# Patient Record
Sex: Female | Born: 2000 | Race: Black or African American | Hispanic: No | Marital: Single | Smoking: Never smoker
Health system: Southern US, Community
[De-identification: ages and names within clinical notes are randomized; demographics above are authoritative.]

## PROBLEM LIST (undated history)

## (undated) DIAGNOSIS — E785 Hyperlipidemia, unspecified: Secondary | ICD-10-CM

## (undated) DIAGNOSIS — Z973 Presence of spectacles and contact lenses: Secondary | ICD-10-CM

## (undated) HISTORY — DX: Presence of spectacles and contact lenses: Z97.3

## (undated) HISTORY — DX: Hyperlipidemia, unspecified: E78.5

## (undated) HISTORY — PX: NO PAST SURGERIES: SHX2092

---
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
## 2020-04-30 IMAGING — US US PELVIS COMPLETE
1 series · 14 of 25 positions shown · non-contrast
Comparison: 04/01/2013

CLINICAL DATA: Secondary amenorrhea.

EXAM:
TRANSABDOMINAL ULTRASOUND OF PELVIS
TECHNIQUE: Transabdominal ultrasound examination of the pelvis was performed
including evaluation of the uterus, ovaries, adnexal regions, and
pelvic cul-de-sac.

[Series 1: us pelvis complete · 0.22mm/px · 14 of 32 slices shown]
[im 1/32]
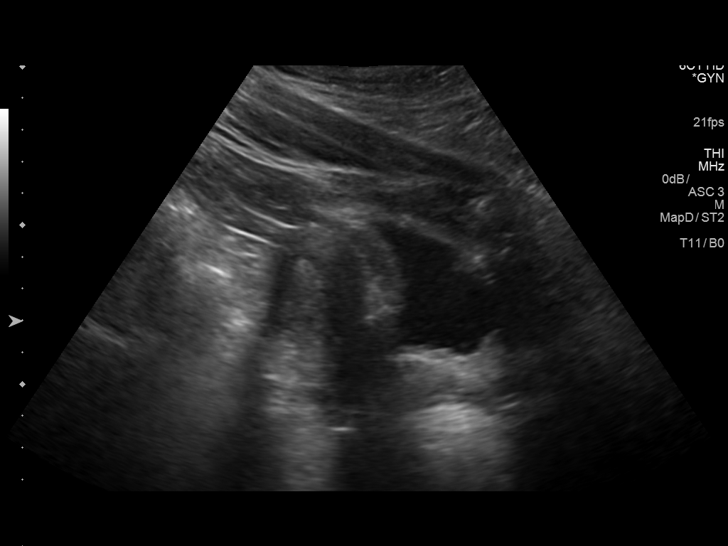
[im 3/32]
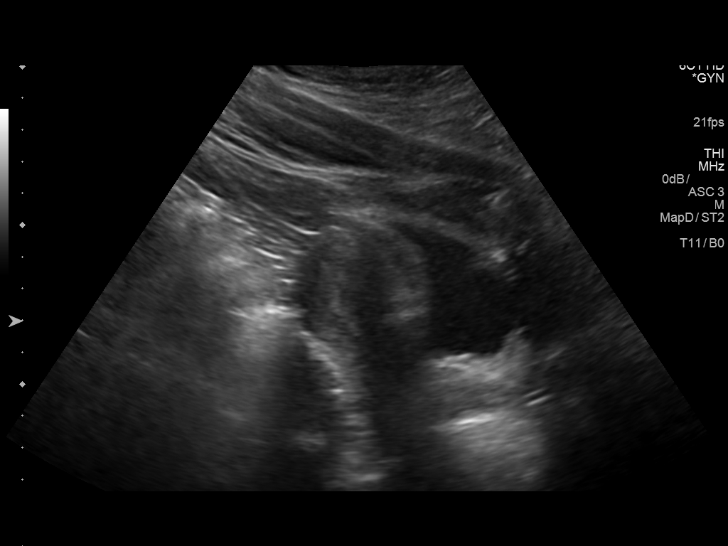
[im 6/32]
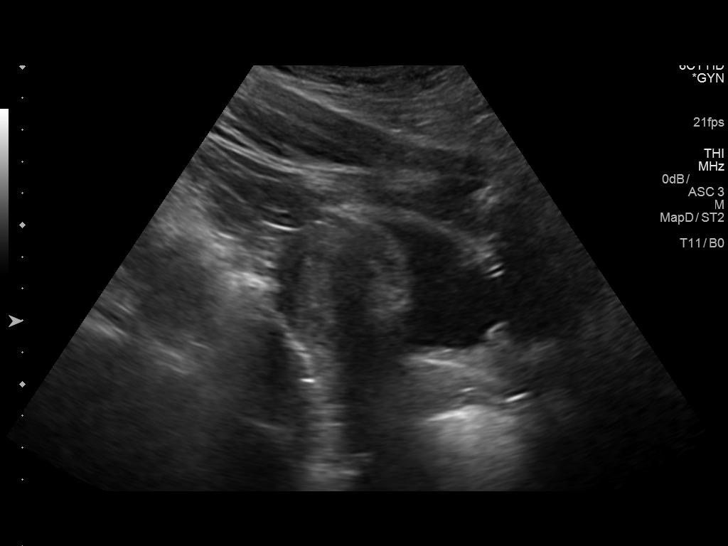
[im 8/32]
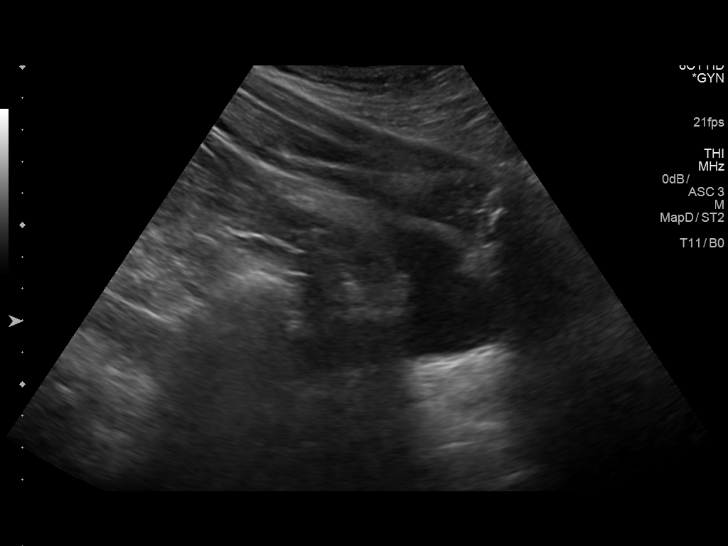
[im 11/32]
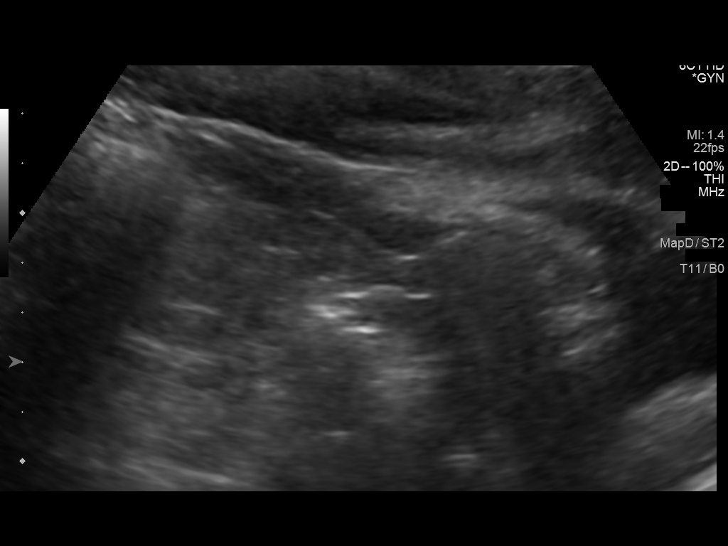
[im 12/32]
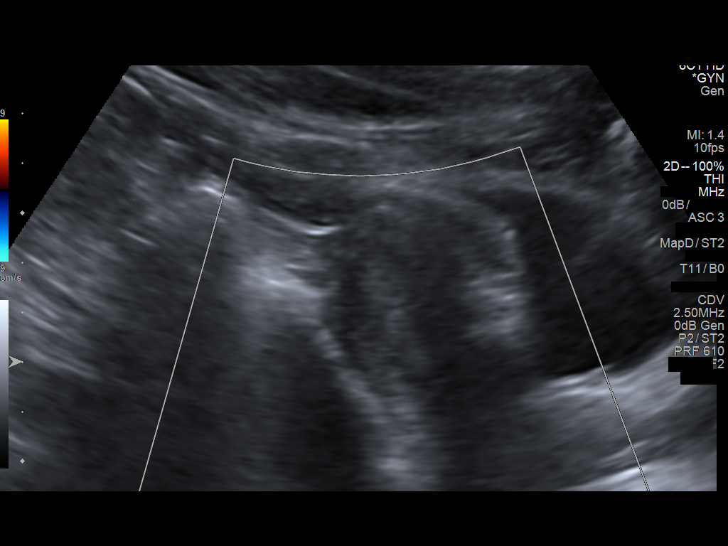
[im 15/32]
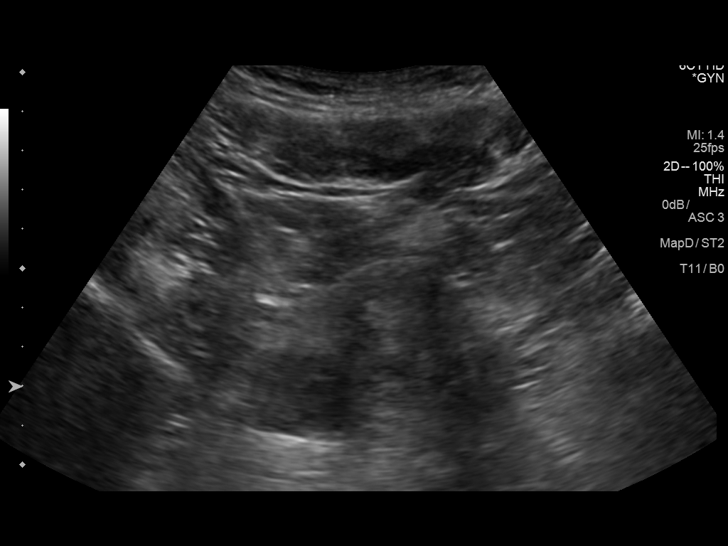
[im 17/32]
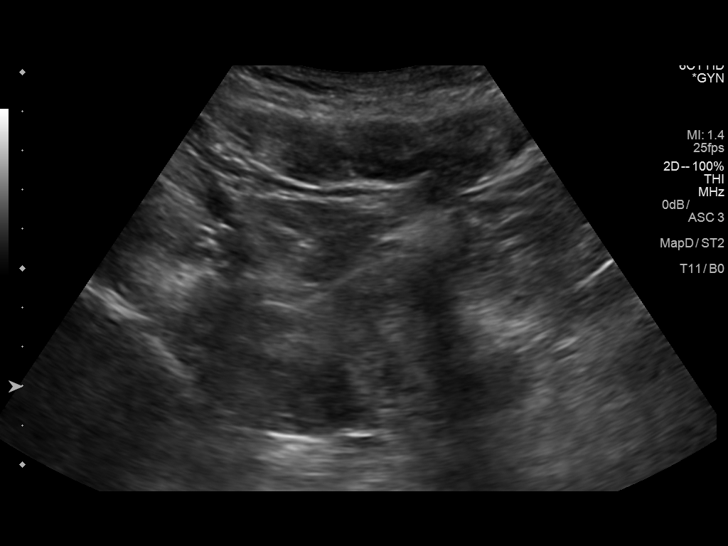
[im 20/32]
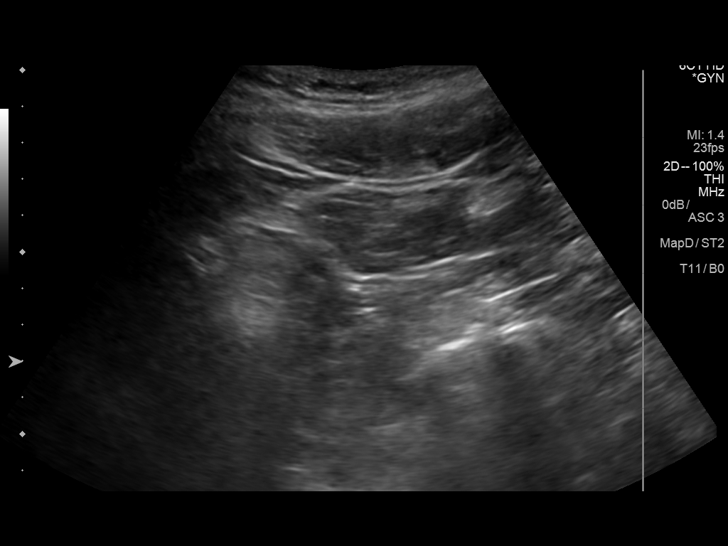
[im 21/32]
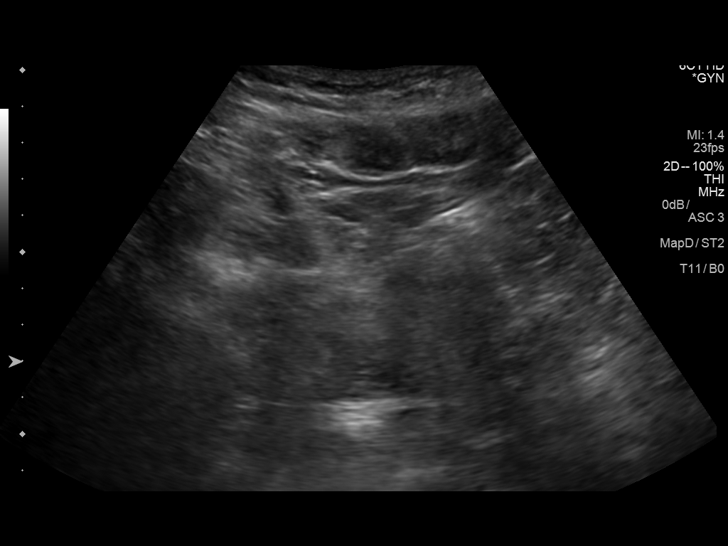
[im 24/32]
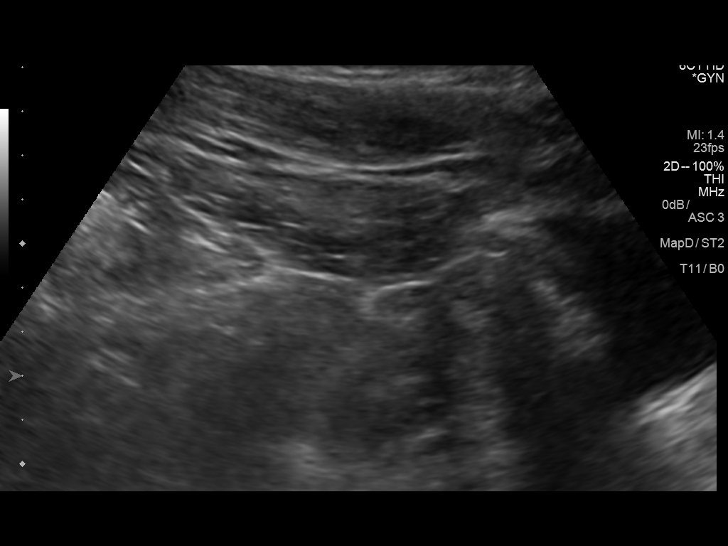
[im 26/32]
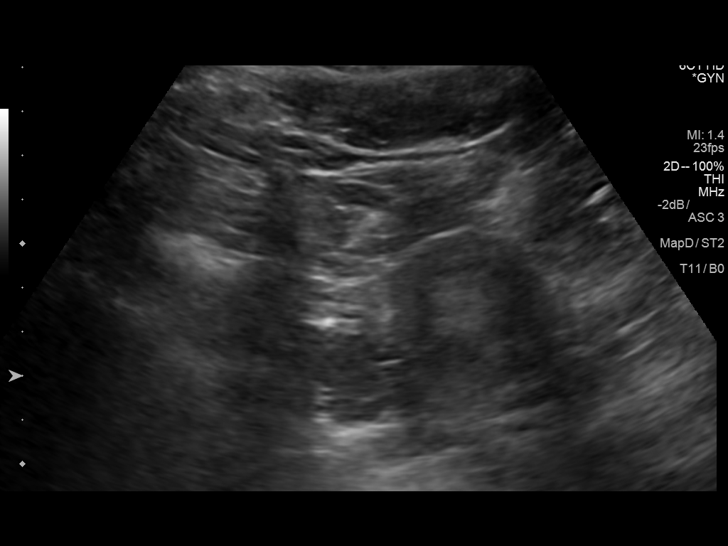
[im 29/32]
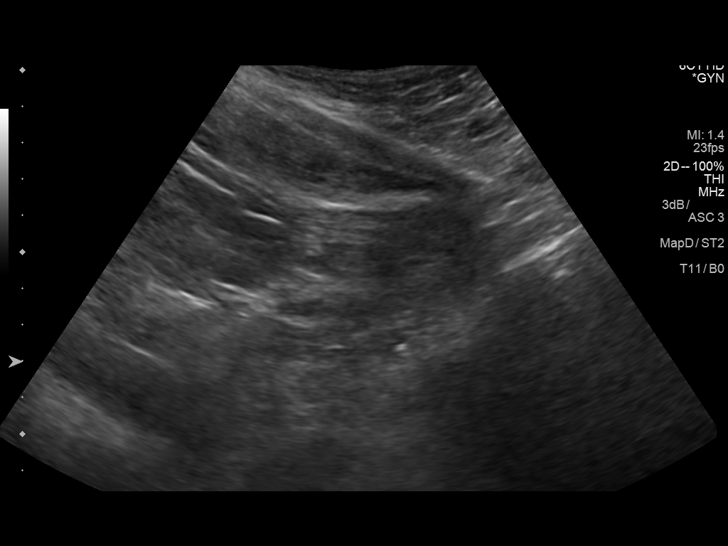
[im 32/32]
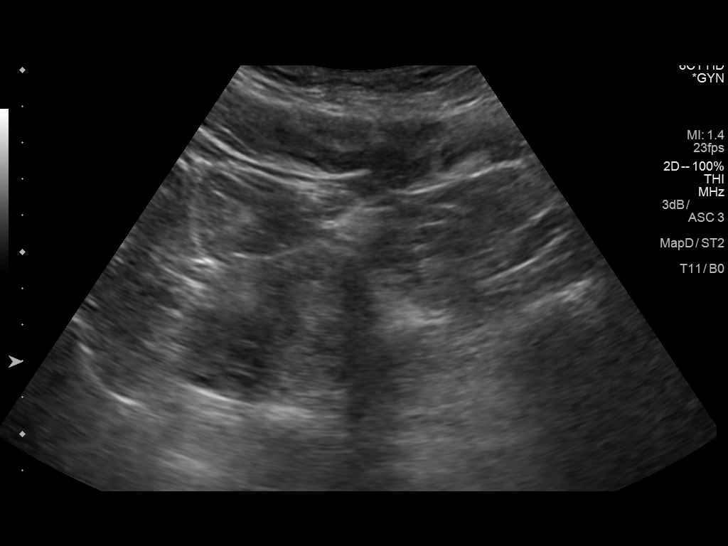

[14 of 25 positions shown; findings below may reference images not displayed]

FINDINGS: Uterus

Measurements: 6.3 x 3.1 x 3.7 cm = volume: 38 mL. Normal in
morphology.

Endometrium

Thickness: Normal, 12 mm.  No focal abnormality visualized.

Right ovary

Measurements: 4.3 x 2.1 x 2.2 cm = volume: 10 mL. Normal in
morphology.

Left ovary

Not visualized-no adnexal mass seen.

Other findings:  No abnormal free fluid.
IMPRESSION: 1. Lack of visualization of the left ovary. Otherwise, normal
transabdominal pelvic ultrasound.
2. Transvaginal scanning was not performed at patient request.

## 2020-08-28 ENCOUNTER — Ambulatory Visit (INDEPENDENT_AMBULATORY_CARE_PROVIDER_SITE_OTHER): Payer: 59 | Admitting: Family Medicine

## 2020-08-28 ENCOUNTER — Other Ambulatory Visit: Payer: Self-pay

## 2020-08-28 ENCOUNTER — Encounter: Payer: Self-pay | Admitting: Family Medicine

## 2020-08-28 VITALS — BP 122/74 | HR 106 | Ht 63.25 in | Wt 256.0 lb

## 2020-08-28 DIAGNOSIS — F4323 Adjustment disorder with mixed anxiety and depressed mood: Secondary | ICD-10-CM | POA: Diagnosis not present

## 2020-08-28 DIAGNOSIS — Z0000 Encounter for general adult medical examination without abnormal findings: Secondary | ICD-10-CM | POA: Diagnosis not present

## 2020-08-28 NOTE — Progress Notes (Signed)
Pt is not sexually active.  

## 2020-08-28 NOTE — Progress Notes (Signed)
Subjective:     Whitney Griffin is a 20 y.o. female and is here for a comprehensive physical exam. The patient reports no problems.  She did screen positive on the PHQ and the GAD-7.  Reviewed those results today.  She is currently working part-time at [employer] and is going to school full-time.  She walks daily with her new dog which has been very positive for her and occasionally does weights.  She is only getting about 4 to 5 hours of sleep at night.  Social History   Socioeconomic History   Marital status: Single    Spouse name: Not on file   Number of children: Not on file   Years of education: Not on file   Highest education level: Not on file  Occupational History   Occupation: Dentist: Minor   Tobacco Use   Smoking status: Never   Smokeless tobacco: Never  Substance and Sexual Activity   Alcohol use: No   Drug use: No   Sexual activity: Not on file  Other Topics Concern   Not on file  Social History Narrative   Studetn at [employer] in the 9th grade. + Cheerleading.  Tack and filed.  Does well in school. Born in [facility].  Lives with mother [name], father [name], older brother [name]. Mom is a Runner, broadcasting/film/video and has her BA degree. Father has his masters.  Sing in chorus.     Social Determinants of Health   Financial Resource Strain: Not on file  Food Insecurity: Not on file  Transportation Needs: Not on file  Physical Activity: Not on file  Stress: Not on file  Social Connections: Not on file  Intimate Partner Violence: Not on file   Health Maintenance  Topic Date Due   HIV Screening  Never done   Hepatitis C Screening  Never done   INFLUENZA VACCINE  09/24/2020   TETANUS/TDAP  10/02/2021   HPV VACCINES  Completed   COVID-19 Vaccine  Completed   Pneumococcal Vaccine 21-98 Years old  Aged Out    The following portions of the patient's history were reviewed and updated as appropriate: allergies, current medications, past  family history, past medical history, past social history, past surgical history, and problem list.  Review of Systems Pertinent items are noted in HPI.   Objective:    BP 122/74   Pulse (!) 106   Ht 5' 3.25" (1.607 m)   Wt 256 lb (116.1 kg)   LMP 04/04/2020 (Exact Date)   SpO2 99%   BMI 44.99 kg/m  General appearance: alert, cooperative, and appears stated age Head: Normocephalic, without obvious abnormality, atraumatic Eyes:  conj clear, EOMI, PEERLA Ears: normal TM's and external ear canals both ears Nose: Nares normal. Septum midline. Mucosa normal. No drainage or sinus tenderness. Throat: lips, mucosa, and tongue normal; teeth and gums normal Neck: no adenopathy, no JVD, supple, symmetrical, trachea midline, and thyroid not enlarged, symmetric, no tenderness/mass/nodules Back: symmetric, no curvature. ROM normal. No CVA tenderness. Lungs: clear to auscultation bilaterally Heart: regular rate and rhythm, S1, S2 normal, no murmur, click, rub or gallop Abdomen: soft, non-tender; bowel sounds normal; no masses,  no organomegaly  Extremities: extremities normal, atraumatic, no cyanosis or edema Pulses: 2+ and symmetric Skin: Skin color, texture, turgor normal. No rashes or lesions Lymph nodes: Cervical adenopathy: nl and Supraclavicular adenopathy: nl    Assessment:    Healthy female exam.    Plan:  See After Visit Summary for Counseling Recommendations  Keep up a regular exercise program and make sure you are eating a healthy diet Try to eat 4 servings of dairy a day, or if you are lactose intolerant take a calcium with vitamin D daily.  Your vaccines are up to date.   Moderate depression and anxiety symptoms currently-discussed options.  She is interested in therapy/counseling as she has never done this before.  New referral placed.

## 2020-09-03 ENCOUNTER — Encounter: Payer: No Typology Code available for payment source | Admitting: Family Medicine

## 2020-09-18 LAB — TSH: TSH: 2.09 mIU/L

## 2020-09-18 LAB — LIPID PANEL W/REFLEX DIRECT LDL
Cholesterol: 221 mg/dL — ABNORMAL HIGH (ref <200)
HDL: 52 mg/dL (ref >=50)
LDL Cholesterol (Calc): 150 mg/dL (calc) — ABNORMAL HIGH
Non-HDL Cholesterol (Calc): 169 mg/dL (calc) — ABNORMAL HIGH (ref <130)
Total CHOL/HDL Ratio: 4.3 (calc) (ref <5.0)
Triglycerides: 88 mg/dL (ref <150)

## 2020-09-18 LAB — COMPLETE METABOLIC PANEL WITH GFR
AG Ratio: 1.1 (calc) (ref 1.0–2.5)
ALT: 21 U/L (ref 6–29)
AST: 19 U/L (ref 10–30)
Albumin: 4.3 g/dL (ref 3.6–5.1)
Alkaline phosphatase (APISO): 99 U/L (ref 31–125)
BUN: 12 mg/dL (ref 7–25)
CO2: 24 mmol/L (ref 20–32)
Calcium: 9.8 mg/dL (ref 8.6–10.2)
Chloride: 104 mmol/L (ref 98–110)
Creat: 0.75 mg/dL (ref 0.50–0.96)
Globulin: 3.8 g/dL (calc) — ABNORMAL HIGH (ref 1.9–3.7)
Glucose, Bld: 80 mg/dL (ref 65–99)
Potassium: 4.4 mmol/L (ref 3.5–5.3)
Sodium: 136 mmol/L (ref 135–146)
Total Bilirubin: 0.3 mg/dL (ref 0.2–1.2)
Total Protein: 8.1 g/dL (ref 6.1–8.1)
eGFR: 117 mL/min/{1.73_m2} (ref >=60)

## 2020-09-18 LAB — CBC
HCT: 45.3 % — ABNORMAL HIGH (ref 35.0–45.0)
Hemoglobin: 15.5 g/dL (ref 11.7–15.5)
MCH: 28.9 pg (ref 27.0–33.0)
MCHC: 34.2 g/dL (ref 32.0–36.0)
MCV: 84.5 fL (ref 80.0–100.0)
MPV: 10.4 fL (ref 7.5–12.5)
Platelets: 351 10*3/uL (ref 140–400)
RBC: 5.36 10*6/uL — ABNORMAL HIGH (ref 3.80–5.10)
RDW: 12.9 % (ref 11.0–15.0)
WBC: 5.1 10*3/uL (ref 3.8–10.8)

## 2020-09-24 ENCOUNTER — Ambulatory Visit (INDEPENDENT_AMBULATORY_CARE_PROVIDER_SITE_OTHER): Payer: 59 | Admitting: Professional

## 2020-09-24 DIAGNOSIS — F4321 Adjustment disorder with depressed mood: Secondary | ICD-10-CM

## 2020-09-24 DIAGNOSIS — F411 Generalized anxiety disorder: Secondary | ICD-10-CM | POA: Diagnosis not present

## 2020-09-27 ENCOUNTER — Telehealth: Payer: Self-pay | Admitting: Family Medicine

## 2020-09-27 NOTE — Telephone Encounter (Signed)
Patient dropped off forms for PCP to fill out. Billing form attached and placed in provider box. AM *09/27/20

## 2020-09-28 NOTE — Telephone Encounter (Signed)
2 forms were in the folder.  1 was for a physical exam everything is complete except they do want [place] to do a urinalysis and it says it is required so she will have to come by and give a urine sample and then we should be able to complete that one for her.  The second form was for request to have a support animal in her living space.  It also requires that she have a significant impact her disability that requires an animal which she does not currently have so I do not have adequate information or documentation to fill out her form for support animal.  They have to have a physical or mental impairment that "substantially limits" a major life activity.

## 2020-10-01 NOTE — Telephone Encounter (Signed)
Whitney Griffin called back asking about the form. I advised as recommended by Dr Linford Arnold. She states she will not have time to do the urinalysis. She will have the form filled out by the school.

## 2020-10-08 ENCOUNTER — Ambulatory Visit (INDEPENDENT_AMBULATORY_CARE_PROVIDER_SITE_OTHER): Payer: 59 | Admitting: Professional

## 2020-10-08 ENCOUNTER — Ambulatory Visit: Payer: 59 | Admitting: Professional

## 2020-10-08 DIAGNOSIS — F411 Generalized anxiety disorder: Secondary | ICD-10-CM

## 2020-10-08 DIAGNOSIS — F4321 Adjustment disorder with depressed mood: Secondary | ICD-10-CM

## 2020-10-19 ENCOUNTER — Telehealth: Payer: Self-pay

## 2020-10-19 NOTE — Telephone Encounter (Signed)
I do have the records in my basket I will take a look at those.

## 2020-10-19 NOTE — Telephone Encounter (Signed)
Whitney Griffin called and left a message asking about the medical records and the need of a support animal.

## 2020-10-23 ENCOUNTER — Encounter: Payer: Self-pay | Admitting: Family Medicine

## 2020-10-23 NOTE — Telephone Encounter (Signed)
Pt called and asked if her forms have been completed. I informed her that they have not. However, Dr. Linford Arnold is looking at them.   She asked if these can be completed today. She stated that she doesn't mean to be a bother but she will need to turn them in ASAP.

## 2020-10-23 NOTE — Telephone Encounter (Signed)
I advised patient to MyChart [place] the form.

## 2020-10-24 ENCOUNTER — Ambulatory Visit (INDEPENDENT_AMBULATORY_CARE_PROVIDER_SITE_OTHER): Payer: 59 | Admitting: Professional

## 2020-10-24 DIAGNOSIS — F4321 Adjustment disorder with depressed mood: Secondary | ICD-10-CM | POA: Diagnosis not present

## 2020-10-24 DIAGNOSIS — F411 Generalized anxiety disorder: Secondary | ICD-10-CM

## 2020-10-24 NOTE — Telephone Encounter (Signed)
Patient advised. She will pick up later.

## 2020-10-24 NOTE — Telephone Encounter (Signed)
Patient advised. She will pick up later.  

## 2020-10-24 NOTE — Telephone Encounter (Signed)
Forms completed and palced on [employer]. I think there are 2 notes open about this.

## 2020-10-24 NOTE — Telephone Encounter (Signed)
Forms completed

## 2020-11-07 ENCOUNTER — Ambulatory Visit (INDEPENDENT_AMBULATORY_CARE_PROVIDER_SITE_OTHER): Payer: 59 | Admitting: Professional

## 2020-11-07 DIAGNOSIS — F411 Generalized anxiety disorder: Secondary | ICD-10-CM | POA: Diagnosis not present

## 2020-11-07 DIAGNOSIS — F4321 Adjustment disorder with depressed mood: Secondary | ICD-10-CM | POA: Diagnosis not present

## 2020-11-28 ENCOUNTER — Ambulatory Visit (INDEPENDENT_AMBULATORY_CARE_PROVIDER_SITE_OTHER): Payer: 59 | Admitting: Professional

## 2020-11-28 DIAGNOSIS — F411 Generalized anxiety disorder: Secondary | ICD-10-CM | POA: Diagnosis not present

## 2020-11-28 DIAGNOSIS — F4321 Adjustment disorder with depressed mood: Secondary | ICD-10-CM | POA: Diagnosis not present

## 2020-12-10 ENCOUNTER — Ambulatory Visit: Payer: 59 | Admitting: Professional

## 2020-12-26 ENCOUNTER — Ambulatory Visit: Payer: 59 | Admitting: Professional

## 2021-08-29 ENCOUNTER — Telehealth: Payer: Self-pay | Admitting: Family Medicine

## 2021-08-29 NOTE — Telephone Encounter (Signed)
Patients mother dropped off Health Forms for School to be completed by PCP Linford Arnold). Patients mother was informed of possible fee and 3-5 day turn around - paperwork placed in providers box - lmr.

## 2021-08-29 NOTE — Telephone Encounter (Signed)
Pt has an appt on 8/15 with pcp

## 2021-08-30 NOTE — Telephone Encounter (Signed)
Patient's mother stated she would be by later to pay the fee & pick up the forms, placed in paperwork payment according waiting for pick up.A MUCK

## 2021-08-30 NOTE — Telephone Encounter (Signed)
Form completed, and placed up front for pt's mother to be contacted for payment.

## 2021-09-06 ENCOUNTER — Ambulatory Visit: Payer: 59

## 2021-10-08 ENCOUNTER — Encounter: Payer: 59 | Admitting: Family Medicine

## 2022-09-17 ENCOUNTER — Ambulatory Visit (INDEPENDENT_AMBULATORY_CARE_PROVIDER_SITE_OTHER): Payer: No Typology Code available for payment source | Admitting: Family Medicine

## 2022-09-17 ENCOUNTER — Encounter: Payer: Self-pay | Admitting: Family Medicine

## 2022-09-17 VITALS — BP 133/80 | HR 72 | Resp 16 | Ht 64.0 in | Wt 260.1 lb

## 2022-09-17 DIAGNOSIS — Z113 Encounter for screening for infections with a predominantly sexual mode of transmission: Secondary | ICD-10-CM | POA: Diagnosis not present

## 2022-09-17 DIAGNOSIS — Z0000 Encounter for general adult medical examination without abnormal findings: Secondary | ICD-10-CM | POA: Diagnosis not present

## 2022-09-17 DIAGNOSIS — E785 Hyperlipidemia, unspecified: Secondary | ICD-10-CM

## 2022-09-17 DIAGNOSIS — Z23 Encounter for immunization: Secondary | ICD-10-CM

## 2022-09-17 DIAGNOSIS — F418 Other specified anxiety disorders: Secondary | ICD-10-CM | POA: Insufficient documentation

## 2022-09-17 DIAGNOSIS — Z124 Encounter for screening for malignant neoplasm of cervix: Secondary | ICD-10-CM

## 2022-09-17 NOTE — Assessment & Plan Note (Signed)
Discussed counseling. She is getting a new counselor this fall. Has an ESA.  Can consider medication as well.

## 2022-09-17 NOTE — Progress Notes (Signed)
Complete physical exam  Patient: Whitney Griffin   DOB: 09/16/2000   22 y.o. Female  MRN: [id number]  Subjective:    Chief Complaint  Patient presents with   Annual Exam    Whitney Griffin is a 22 y.o. female who presents today for a complete physical exam. She reports consuming a general diet. The patient does not participate in regular exercise at present. She generally feels well. She reports sleeping fairly well. She does not have additional problems to discuss today.    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   Most recent fall risk assessment:    09/17/2022   10:25 AM  Fall Risk   Falls in the past year? 0  Number falls in past yr: 0  Injury with Fall? 0  Risk for fall due to : No Fall Risks  Follow up Falls evaluation completed     Most recent depression screenings:    09/17/2022    9:36 AM 08/28/2020    3:16 PM  PHQ 2/9 Scores  PHQ - 2 Score 2 4  PHQ- 9 Score 9 11        Patient Care Team: Agapito Games, MD as PCP - General (Family Medicine)   No outpatient medications prior to visit.   No facility-administered medications prior to visit.    ROS        Objective:     BP 133/80 (BP Location: Left Arm, Patient Position: Sitting, Cuff Size: Large)   Pulse 72   Resp 16   Ht 5\' 4"  (1.626 m)   Wt 260 lb 1.3 oz (118 kg)   SpO2 97%   BMI 44.64 kg/m    Physical Exam Vitals reviewed.  Constitutional:      Appearance: Normal appearance. She is well-developed.  HENT:     Head: Normocephalic and atraumatic.     Right Ear: Tympanic membrane, ear canal and external ear normal.     Left Ear: Tympanic membrane, ear canal and external ear normal.     Nose: Nose normal.     Mouth/Throat:     Pharynx: Oropharynx is clear.  Eyes:     Conjunctiva/sclera: Conjunctivae normal.     Pupils: Pupils are equal, round, and reactive to light.  Neck:     Thyroid:  No thyromegaly.  Cardiovascular:     Rate and Rhythm: Normal rate and regular rhythm.     Heart sounds: Normal heart sounds.  Pulmonary:     Effort: Pulmonary effort is normal.     Breath sounds: Normal breath sounds. No wheezing.  Abdominal:     General: Bowel sounds are normal.     Palpations: Abdomen is soft.  Musculoskeletal:     Cervical back: Neck supple.  Lymphadenopathy:     Cervical: No cervical adenopathy.  Skin:    General: Skin is warm and dry.     Coloration: Skin is not pale.  Neurological:     Mental Status: She is alert and oriented to person, place, and time.  Psychiatric:        Mood and Affect: Mood normal.        Behavior: Behavior normal.      No results found for any visits on 09/17/22.     Assessment & Plan:    Routine Health Maintenance and Physical Exam  Immunization History  Administered Date(s) Administered   DTaP 06/04/2000, 08/19/2000,  11/22/2000, 07/30/2001, 10/14/2004   HIB (PRP-OMP) 06/04/2000, 08/19/2000, 11/17/2000, 07/30/2001   HPV Quadrivalent 10/03/2011, 12/01/2012, 03/31/2016   Hepatitis A, Adult 08/24/2018, 09/02/2019   Hepatitis B 2000/06/11, 05/04/2000, 11/22/2000   IPV 06/04/2000, 08/19/2000, 07/30/2001, 08/09/2004   Influenza,inj,Quad PF,6+ Mos 12/01/2012   Influenza-Unspecified 01/05/2001, 02/19/2001   Janssen (J&J) SARS-COV-2 Vaccination 06/03/2019   MMR 04/05/2001, 08/09/2004   Meningococcal B, OMV 08/24/2018   Meningococcal Conjugate 10/03/2011   Meningococcal Mcv4o 08/24/2018   PFIZER(Purple Top)SARS-COV-2 Vaccination 02/21/2020   PPD Test 09/22/2018   Pneumococcal Conjugate-13 06/04/2000, 11/17/2000   Rotavirus Monovalent 06/04/2000, 01/05/2001   Tdap 10/03/2011, 09/17/2022   Varicella 04/05/2001, 09/28/2008    Health Maintenance  Topic Date Due   HIV Screening  Never done   Hepatitis C Screening  Never done   PAP-Cervical Cytology Screening  Never done   PAP SMEAR-Modifier  Never done   COVID-19 Vaccine (3  - 2023-24 season) 10/25/2021   INFLUENZA VACCINE  09/25/2022   DTaP/Tdap/Td (8 - Td or Tdap) 09/16/2032   HPV VACCINES  Completed   Pneumococcal Vaccine 30-42 Years old  Aged Out    Discussed health benefits of physical activity, and encouraged her to engage in regular exercise appropriate for her age and condition.  Problem List Items Addressed This Visit       Other   Hyperlipidemia   Relevant Orders   CMP14+EGFR   Lipid Panel With LDL/HDL Ratio   CBC   Depression with anxiety    Discussed counseling. She is getting a new counselor this fall. Has an ESA.  Can consider medication as well.       Other Visit Diagnoses     Wellness examination    -  Primary   Relevant Orders   CMP14+EGFR   Lipid Panel With LDL/HDL Ratio   CBC   Screening for malignant neoplasm of cervix       Screening examination for STI       Encounter for immunization       Relevant Orders   Tdap vaccine greater than or equal to 7yo IM (Completed)       Keep up a regular exercise program and make sure you are eating a healthy diet Try to eat 4 servings of dairy a day, or if you are lactose intolerant take a calcium with vitamin D daily.  Your vaccines are up to date.   Tdap given today.   Depression with anxiety Discussed counseling. She is getting a new counselor this fall. Has an ESA.  Can consider medication as well.    Return in about 1 year (around 09/17/2023) for Wellness Exam.     Nani Gasser, MD

## 2022-10-01 ENCOUNTER — Telehealth: Payer: Self-pay | Admitting: Family Medicine

## 2022-10-01 NOTE — Telephone Encounter (Signed)
Pt's forms for 2024 year completed faxed confirmation received and scanned into patients chart.

## 2022-10-01 NOTE — Telephone Encounter (Signed)
Whitney Griffin from [employer] [phone]) called in for verification of ESA paperwork. Paperwork was signed 08/31/22, unsure of authenticity of paperwork as the other dates listed are from 2022. Paperwork seems to be sent it in for completion in 09/2020. Please Advise

## 2022-10-06 ENCOUNTER — Telehealth: Payer: Self-pay

## 2022-10-06 NOTE — Telephone Encounter (Signed)
Patient came by office to drop off Physical/ Immunzation form for school, forms placed in Dr. Shelah Lewandowsky box, thanks.

## 2022-10-06 NOTE — Telephone Encounter (Signed)
Completed and placed in tony B box

## 2022-10-09 NOTE — Telephone Encounter (Signed)
Placed in blue folder outside of front office.

## 2022-10-09 NOTE — Telephone Encounter (Signed)
Please call pt to inform her that her forms are ready for pick up

## 2022-11-04 ENCOUNTER — Ambulatory Visit (INDEPENDENT_AMBULATORY_CARE_PROVIDER_SITE_OTHER): Payer: No Typology Code available for payment source

## 2022-11-04 DIAGNOSIS — Z111 Encounter for screening for respiratory tuberculosis: Secondary | ICD-10-CM

## 2022-11-04 MED ORDER — TUBERSOL 5 UNIT/0.1ML ID SOLN: 0.1000 mL | Freq: Once | INTRADERMAL | 0 refills | Status: DC

## 2022-11-04 NOTE — Patient Instructions (Signed)
return in 48 to 72 hours for PPD read.

## 2022-11-04 NOTE — Progress Notes (Signed)
   Established Patient Office Visit  Subjective   Patient ID: Whitney Griffin, female    DOB: [birth year]  Age: 22 y.o. MRN: [id number]  Chief Complaint  Patient presents with   PPD Placement    PPD placement    HPI  TB screening- PPD placement nurse visit.   ROS    Objective:     There were no vitals taken for this visit.   Physical Exam   No results found for any visits on 11/04/22.    The ASCVD Risk score (Arnett DK, et al., 2019) failed to calculate for the following reasons:   The 2019 ASCVD risk score is only valid for ages 72 to 42    Assessment & Plan:  Admin 0.1 Tubersol ID Right forearm. Patient tolerated injection well without complications. Return in 48 to 72 hours for PPD read . Problem List Items Addressed This Visit   None Visit Diagnoses     Screening for tuberculosis    -  Primary   Relevant Orders   PPD (Completed)       Return for return in 48 to 72 hours for PPD read. Elizabeth Palau, LPN

## 2022-11-06 ENCOUNTER — Ambulatory Visit (INDEPENDENT_AMBULATORY_CARE_PROVIDER_SITE_OTHER): Payer: No Typology Code available for payment source | Admitting: Family Medicine

## 2022-11-06 DIAGNOSIS — Z111 Encounter for screening for respiratory tuberculosis: Secondary | ICD-10-CM | POA: Diagnosis not present

## 2022-11-06 LAB — TB SKIN TEST
Induration: 0 mm
TB Skin Test: NEGATIVE

## 2022-11-06 NOTE — Progress Notes (Signed)
Patient is here for PPD reading. Results negative/ 00mm. Copy of the results provided to the patient.

## 2023-06-03 ENCOUNTER — Ambulatory Visit: Payer: Self-pay

## 2023-06-03 ENCOUNTER — Encounter: Payer: Self-pay | Admitting: Physician Assistant

## 2023-06-03 ENCOUNTER — Ambulatory Visit: Payer: Self-pay | Admitting: Physician Assistant

## 2023-06-03 VITALS — BP 157/83 | HR 84 | Temp 98.6°F | Ht 64.0 in | Wt 280.0 lb

## 2023-06-03 DIAGNOSIS — R6889 Other general symptoms and signs: Secondary | ICD-10-CM

## 2023-06-03 DIAGNOSIS — J101 Influenza due to other identified influenza virus with other respiratory manifestations: Secondary | ICD-10-CM

## 2023-06-03 DIAGNOSIS — H66001 Acute suppurative otitis media without spontaneous rupture of ear drum, right ear: Secondary | ICD-10-CM

## 2023-06-03 LAB — POCT RAPID STREP A (OFFICE): Rapid Strep A Screen: NEGATIVE

## 2023-06-03 LAB — POCT INFLUENZA A/B
Influenza A, POC: NEGATIVE
Influenza B, POC: POSITIVE — AB

## 2023-06-03 LAB — POC COVID19 BINAXNOW: SARS Coronavirus 2 Ag: NEGATIVE

## 2023-06-03 MED ORDER — AMOXICILLIN-POT CLAVULANATE 875-125 MG PO TABS
1.0000 | ORAL_TABLET | Freq: Two times a day (BID) | ORAL | 0 refills | Status: AC
Start: 2023-06-03 — End: ?

## 2023-06-03 MED ORDER — FLUTICASONE PROPIONATE 50 MCG/ACT NA SUSP
2.0000 | Freq: Every day | NASAL | 0 refills | Status: AC
Start: 2023-06-03 — End: ?

## 2023-06-03 NOTE — Progress Notes (Unsigned)
 Acute Office Visit  Subjective:     Patient ID: Whitney Griffin, female    DOB: [birth year], 23 y.o.   MRN: [id number]  Chief Complaint  Patient presents with   Cough    Cough and ear pain, drowsy , sinus congestion    HPI Patient is in today for cough, right ear pain, congestion, sinus pressure, and fatigue for the last 4 days but worsened today. She thought it was allergies at first. She has been taking zyrtec, dayquil and nyquil. She does not think she has ran a fever. 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 Her right ear pain is worsening a lot.   ROS  See HPI.     Objective:    BP (!) 157/83   Pulse 84   Temp 98.6 F (37 C) (Oral)   Ht 5\' 4"  (1.626 m)   Wt 280 lb (127 kg)   SpO2 99%   BMI 48.06 kg/m  BP Readings from Last 3 Encounters:  06/03/23 (!) 157/83  09/17/22 133/80  08/28/20 122/74   Wt Readings from Last 3 Encounters:  06/03/23 280 lb (127 kg)  09/17/22 260 lb 1.3 oz (118 kg)  08/28/20 256 lb (116.1 kg)      Physical Exam Constitutional:      Appearance: Normal appearance.  HENT:     Head: Normocephalic.     Right Ear: Ear canal and external ear normal. There is no impacted cerumen.     Left Ear: Tympanic membrane, ear canal and external ear normal. There is no impacted cerumen.     Ears:     Comments: Right TM bulging, dull and erythematous.     Nose: Congestion present.     Mouth/Throat:     Mouth: Mucous membranes are moist.     Pharynx: Posterior oropharyngeal erythema present. No oropharyngeal exudate.  Eyes:     Conjunctiva/sclera: Conjunctivae normal.  Cardiovascular:     Rate and Rhythm: Normal rate.  Pulmonary:     Effort: Pulmonary effort is normal.     Breath sounds: Normal breath sounds.  Musculoskeletal:     Cervical back: Normal range of motion and neck supple. No tenderness.     Right lower leg: No edema.     Left lower leg: No edema.  Lymphadenopathy:     Cervical: No cervical adenopathy.  Neurological:     General: No focal  deficit present.     Mental Status: She is alert and oriented to person, place, and time.  Psychiatric:        Mood and Affect: Mood normal.     Results for orders placed or performed in visit on 06/03/23  POC COVID-19  Result Value Ref Range   SARS Coronavirus 2 Ag Negative Negative  POCT rapid strep A  Result Value Ref Range   Rapid Strep A Screen Negative Negative  POCT Influenza A/B  Result Value Ref Range   Influenza A, POC Negative Negative   Influenza B, POC Positive (A) Negative        Assessment & Plan:  Marland KitchenMarland KitchenCaleyah was seen today for cough.  Diagnoses and all orders for this visit:  Influenza B  Flu-like symptoms -     POC COVID-19 -     POCT rapid strep A -     POCT Influenza A/B  Non-recurrent acute suppurative otitis media of right ear without spontaneous rupture of tympanic membrane -     fluticasone (FLONASE) 50 MCG/ACT nasal spray; Place 2 sprays into  both nostrils daily. -     amoxicillin-clavulanate (AUGMENTIN) 875-125 MG tablet; Take 1 tablet by mouth 2 (two) times daily.   Flu B positive Negative for strep and covid Out of window for tamiflu Written out of work for the rest of the week Symptomatic care discussed and HO given Right otitis media treatment with augmentin and flonase Follow up as needed if symptoms persist or worsen   Tandy Gaw, PA-C

## 2023-06-03 NOTE — Telephone Encounter (Signed)
 Chief Complaint: Earache Symptoms: earache 5/10, cough, congestion, runny nose Frequency: constant x 5 days  Pertinent Negatives: Patient denies fever, chest pain  Disposition: [] ED /[] Urgent Care (no appt availability in office) / [x] Appointment(In office/virtual)/ []  [facility]/ [] Home Care/ [] Refused Recommended Disposition /[] [facility]/ []  Follow-up with PCP Additional Notes: Patient reports symptoms listed above x 5 days with ear pain starting today. Patient states she has taking OTC to treat what she believed to be allergy symptoms without much improvement. Care advice was given and patient was scheduled for OV today at 1440.  Copied from CRM [id number]. Topic: Clinical - Red Word Triage >> Jun 03, 2023  1:52 PM Everette Rank wrote: Red Word that prompted transfer to Nurse Triage: Allergy's issue/RT ear pain fills blocked just happened 04/09 5/10 pain level Reason for Disposition  Earache  (Exceptions: brief ear pain of < 60 minutes duration, earache occurring during air travel  Answer Assessment - Initial Assessment Questions 1. LOCATION: "Which ear is involved?"     Right ear  2. ONSET: "When did the ear start hurting"      5-6 days  3. SEVERITY: "How bad is the pain?"  (Scale 1-10; mild, moderate or severe)   - MILD (1-3): doesn't interfere with normal activities    - MODERATE (4-7): interferes with normal activities or awakens from sleep    - SEVERE (8-10): excruciating pain, unable to do any normal activities      5/10 4. URI SYMPTOMS: "Do you have a runny nose or cough?"     Coughing, runny nose, congestion  5. FEVER: "Do you have a fever?" If Yes, ask: "What is your temperature, how was it measured, and when did it start?"     No  6. CAUSE: "Have you been swimming recently?", "How often do you use Q-TIPS?", "Have you had any recent air travel or scuba diving?"     No 7. OTHER SYMPTOMS: "Do you have any other symptoms?" (e.g., headache, stiff neck, dizziness,  vomiting, runny nose, decreased hearing)     Headache  8. PREGNANCY: "Is there any chance you are pregnant?" "When was your last menstrual period?"     N/A  Protocols used: Davina Poke

## 2023-06-03 NOTE — Patient Instructions (Addendum)
 Tylenol cold and sinus   Influenza, Adult Influenza is also called the flu. It's an infection that affects your respiratory tract. This includes your nose, throat, windpipe, and lungs. The flu is contagious. This means it spreads easily from person to person. It causes symptoms that are like a cold. It can also cause a high fever and body aches. What are the causes? The flu is caused by the influenza virus. You can get it by: Breathing in droplets that are in the air after an infected person coughs or sneezes. Touching something that has the virus on it and then touching your mouth, nose, or eyes. What increases the risk? You may be more likely to get the flu if: You don't wash your hands often. You're near a lot of people during cold and flu season. You touch your mouth, eyes, or nose without washing your hands first. You don't get a flu shot each year. You may also be more at risk for the flu and serious problems, such as a lung infection called pneumonia, if: You're older than 65. You're pregnant. Your immune system is weak. Your immune system is your body's defense system. You have a long-term, or chronic, condition, such as: Heart, kidney, or lung disease. Diabetes. A liver disorder. Asthma. You're very overweight. You have anemia. This is when you don't have enough red blood cells in your body. What are the signs or symptoms? Flu symptoms often start all of a sudden. They may last 4-14 days and include: Fever and chills. Headaches, body aches, or muscle aches. Sore throat. Cough. Runny or stuffy nose. Discomfort in your chest. Not wanting to eat as much as normal. Feeling weak or tired. Feeling dizzy. Nausea or vomiting. How is this diagnosed? The flu may be diagnosed based on your symptoms and medical history. You may also have a physical exam. A swab may be taken from your nose or throat and tested for the virus. How is this treated? If the flu is found early, you can  be treated with antiviral medicine. This may be given to you by mouth or through an IV. It can help you feel less sick and get better faster. Taking care of yourself at home can also help your symptoms get better. Your health care provider may tell you to: Take over-the-counter medicines. Drink lots of fluids. The flu often goes away on its own. If you have very bad symptoms or problems caused by the flu, you may need to be treated in a hospital. Follow these instructions at home: Activity Rest as needed. Get lots of sleep. Stay home from work or school as told by your provider. Leave home only to go see your provider. Do not leave home for other reasons until you don't have a fever for 24 hours without taking medicine. Eating and drinking Take an oral rehydration solution (ORS). This is a drink that is sold at pharmacies and stores. Drink enough fluid to keep your pee pale yellow. Try to drink small amounts of clear fluids. These include water, ice chips, fruit juice mixed with water, and low-calorie sports drinks. Try to eat bland foods that are easy to digest. These include bananas, applesauce, rice, lean meats, toast, and crackers. Avoid drinks that have a lot of sugar or caffeine in them. These include energy drinks, regular sports drinks, and soda. Do not drink alcohol. Do not eat spicy or fatty foods. General instructions     Take your medicines only as told by your  provider. Use a cool mist humidifier to add moisture to the air in your home. This can make it easier for you to breathe. You should also clean the humidifier every day. To do so: Empty the water. Pour clean water in. Cover your mouth and nose when you cough or sneeze. Wash your hands with soap and water often and for at least 20 seconds. It's extra important to do so after you cough or sneeze. If you can't use soap and water, use hand sanitizer. How is this prevented?  Get a flu shot every year. Ask your provider  when you should get your flu shot. Stay away from people who are sick during fall and winter. Fall and winter are cold and flu season. Contact a health care provider if: You get new symptoms. You have chest pain. You have watery poop, also called diarrhea. You have a fever. Your cough gets worse. You start to have more mucus. You feel like you may vomit, or you vomit. Get help right away if: You become short of breath or have trouble breathing. Your skin or nails turn blue. You have very bad pain or stiffness in your neck. You get a sudden headache or pain in your face or ear. You vomit each time you eat or drink. These symptoms may be an emergency. Call 911 right away. Do not wait to see if the symptoms will go away. Do not drive yourself to the hospital. This information is not intended to replace advice given to you by your health care provider. Make sure you discuss any questions you have with your health care provider. Document Revised: 11/13/2022 Document Reviewed: 03/20/2022 Elsevier Patient Education  2024 [employer].
